# Patient Record
Sex: Male | Born: 2000 | Race: White | Hispanic: No | Marital: Single | Smoking: Never smoker
Health system: Southern US, Community
[De-identification: ages and names within clinical notes are randomized; demographics above are authoritative.]

---
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
## 2020-09-11 ENCOUNTER — Other Ambulatory Visit: Payer: Self-pay

## 2020-09-11 ENCOUNTER — Ambulatory Visit (INDEPENDENT_AMBULATORY_CARE_PROVIDER_SITE_OTHER): Payer: BC Managed Care – PPO

## 2020-09-11 ENCOUNTER — Ambulatory Visit
Admission: EM | Admit: 2020-09-11 | Discharge: 2020-09-11 | Disposition: A | Discharge status: Home / Self Care | Payer: BC Managed Care – PPO | Attending: Sports Medicine | Admitting: Sports Medicine

## 2020-09-11 DIAGNOSIS — M722 Plantar fascial fibromatosis: Secondary | ICD-10-CM

## 2020-09-11 DIAGNOSIS — M79671 Pain in right foot: Secondary | ICD-10-CM

## 2020-09-11 DIAGNOSIS — S99921A Unspecified injury of right foot, initial encounter: Secondary | ICD-10-CM

## 2020-09-11 DIAGNOSIS — M7751 Other enthesopathy of right foot: Secondary | ICD-10-CM

## 2020-09-11 MED ORDER — PREDNISONE 10 MG (21) PO TBPK: ORAL_TABLET | Freq: Every day | ORAL | 0 refills | Status: DC

## 2020-09-11 MED ORDER — METHYLPREDNISOLONE SODIUM SUCC 125 MG IJ SOLR
125.0000 mg | Freq: Once | INTRAMUSCULAR | Status: AC
  Administered 2020-09-11: 125 mg via INTRAMUSCULAR

## 2020-09-11 NOTE — Discharge Instructions (Addendum)
As we discussed, your exam is consistent with several issues.  You certainly have a lot of inflammation in that right heel and have bursitis, and tendinitis as well as plantar fasciitis. We gave you a steroid injection today. I sent in a prescription for oral steroids that you begin tomorrow and you can take with food. Also provided educational handouts. Icing and elevation. No Motrin, Advil, ibuprofen, Naprosyn, Aleve, or aspirin products while taking the steroids.  You can take Tylenol only. We discussed a work note but you deferred. I did give you the name and number of a foot and ankle expert and you can give them a call and get seen as soon as possible.  You may need some physical therapy. If he can get in and your symptoms worsen then please go to the ER for higher level of care.

## 2020-09-11 NOTE — ED Provider Notes (Signed)
[facility]    CSN: [id number] Arrival date & time: 09/11/20  1558      History   Chief Complaint Chief Complaint  Patient presents with   Foot Pain    HPI Joshua Hays is a 20 y.o. male.   20 year old male who presents for evaluation of an injury to his right foot.  He reports she was at the beach more than a week ago and he jumped into a lazy river and kind landed awkwardly on his right foot.  Most the pain was in the heel.  He really did not think much of it.  Was doing a lot of walking and just lounging and relaxing it did not bother him.  Since he has been back into his regular activity and going to work the last few days he has had a lot more pain.  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  He is noted a little bit of swelling but no bruising.  He was concerned there may be a fracture and comes in to urgent care for initial evaluation.  Normally sees Dr. Maryjane HurterFeldpausch at [facility] [place] in [place].  No chronic problems with this foot or ankle.  Has been using ibuprofen and it has not been helping at all.  He denies any back issues numbness tingling or shooting pain down his leg.  No red flag signs or symptoms elicited on history.   History reviewed. No pertinent past medical history.  There are no problems to display for this patient.   History reviewed. No pertinent surgical history.     Home Medications    Prior to Admission medications   Medication Sig Start Date End Date Taking? Authorizing Provider  predniSONE (STERAPRED UNI-PAK 21 TAB) 10 MG (21) TBPK tablet Take by mouth daily. Take 6 tabs by mouth daily  for 2 days, then 5 tabs for 2 days, then 4 tabs for 2 days, then 3 tabs for 2 days, 2 tabs for 2 days, then 1 tab by mouth daily for 2 days. Start 09/12/20 09/11/20  Yes Delton SeeBarnes, Thekla Colborn, MD  amoxicillin (AMOXIL) 875 MG tablet Take 1 tablet (875 mg total) by mouth 2 (two) times daily. 08/30/15   Lutricia Feiloemer, William P, PA-C   amoxicillin-clavulanate (AUGMENTIN) 875-125 MG per tablet Take 1 tablet by mouth 2 (two) times daily. 07/08/14   Hassan RowanWade, Eugene, MD  loratadine-pseudoephedrine (CLARITIN-D 12 HOUR) 5-120 MG per tablet Take 1 tablet by mouth 2 (two) times daily. Sig 1 tablet once or twice a day 07/02/14   Hassan RowanWade, Eugene, MD  valACYclovir Ralph Dowdy(VALTREX) 1000 MG tablet Sig two tablets  Twice a day for one day 07/02/14   Hassan RowanWade, Eugene, MD    Family History History reviewed. No pertinent family history.  Social History Social History   Tobacco Use   Smoking status: Never   Smokeless tobacco: Current    Types: Chew  Vaping Use   Vaping Use: Every day  Substance Use Topics   Alcohol use: No     Allergies   Patient has no known allergies.   Review of Systems Review of Systems  Constitutional:  Positive for activity change. Negative for appetite change, chills, diaphoresis, fatigue and fever.  HENT:  Negative for congestion, ear pain, postnasal drip, rhinorrhea, sinus pressure, sinus pain, sneezing and sore throat.   Eyes:  Negative for pain.  Respiratory:  Negative for cough, chest tightness and shortness of breath.   Cardiovascular:  Negative  for chest pain and palpitations.  Gastrointestinal:  Negative for abdominal pain, diarrhea, nausea and vomiting.  Genitourinary:  Negative for dysuria.  Musculoskeletal:  Positive for arthralgias and gait problem. Negative for back pain, myalgias and neck pain.  Skin:  Negative for color change, pallor, rash and wound.  Neurological:  Negative for dizziness, light-headedness, numbness and headaches.  All other systems reviewed and are negative.   Physical Exam Triage Vital Signs ED Triage Vitals  Enc Vitals Group     BP 09/11/20 1612 132/87     Pulse Rate 09/11/20 1612 73     Resp 09/11/20 1612 18     Temp 09/11/20 1612 98.6 F (37 C)     Temp Source 09/11/20 1612 Oral     SpO2 09/11/20 1612 99 %     Weight 09/11/20 1607 185 lb (83.9 kg)     Height 09/11/20 1607  6\' 3"  (1.905 m)     Head Circumference --      Peak Flow --      Pain Score 09/11/20 1607 6     Pain Loc --      Pain Edu? --      Excl. in GC? --    No data found.  Updated Vital Signs BP 132/87 (BP Location: Left Arm)   Pulse 73   Temp 98.6 F (37 C) (Oral)   Resp 18   Ht 6\' 3"  (1.905 m)   Wt 83.9 kg   SpO2 99%   BMI 23.12 kg/m   Visual Acuity Right Eye Distance:   Left Eye Distance:   Bilateral Distance:    Right Eye Near:   Left Eye Near:    Bilateral Near:     Physical Exam Vitals and nursing note reviewed.  Constitutional:      General: He is not in acute distress.    Appearance: Normal appearance. He is not ill-appearing, toxic-appearing or diaphoretic.  HENT:     Head: Normocephalic and atraumatic.     Nose: Nose normal.     Mouth/Throat:     Mouth: Mucous membranes are moist.  Eyes:     Conjunctiva/sclera: Conjunctivae normal.     Pupils: Pupils are equal, round, and reactive to light.  Cardiovascular:     Rate and Rhythm: Normal rate and regular rhythm.     Pulses: Normal pulses.     Heart sounds: Normal heart sounds. No murmur heard.   No friction rub. No gallop.  Pulmonary:     Effort: Pulmonary effort is normal.     Breath sounds: Normal breath sounds. No stridor. No wheezing, rhonchi or rales.  Musculoskeletal:        General: Tenderness and signs of injury present. No deformity.     Cervical back: Normal range of motion and neck supple.     Comments: Left foot and ankle: Normal to inspection palpation range of motion special test.  Right foot and ankle: No obvious bony abnormality ecchymosis or erythema.  There is a little bit of soft tissue swelling around mostly the medial malleolus and around the tib post tendon.  He is tender to palpation over the retrocalcaneal bursa.  A little bit tender over the fat pad and and over the plantar fascial.  Also little tender over the Achilles tendon.  There is no step deformity noted.  There is no strength  deficits.  Homans and Wilderness Rim tests are negative.  There is no midfoot instability.  No evidence of any tendon retraction.  Neurovascular normal sensation 2+ pulses.  Skin:    General: Skin is warm and dry.     Capillary Refill: Capillary refill takes less than 2 seconds.  Neurological:     General: No focal deficit present.     Mental Status: He is alert and oriented to person, place, and time.     UC Treatments / Results  Labs (all labs ordered are listed, but only abnormal results are displayed) Labs Reviewed - No data to display  EKG   Radiology DG Foot Complete Right  Result Date: 09/11/2020 CLINICAL DATA:  Injury 1 week ago, right foot pain, swelling EXAM: RIGHT FOOT COMPLETE - 3+ VIEW COMPARISON:  None. FINDINGS: Frontal, oblique, and lateral views of the right foot are obtained. No fracture, subluxation, or dislocation. Joint spaces are well preserved. Soft tissues are normal. IMPRESSION: 1. Unremarkable right foot. Electronically Signed   By: Sharlet Salina M.D.   On: 09/11/2020 16:52    Procedures Procedures (including critical care time)  Medications Ordered in UC Medications  methylPREDNISolone sodium succinate (SOLU-MEDROL) 125 mg/2 mL injection 125 mg (125 mg Intramuscular Given 09/11/20 1724)    Initial Impression / Assessment and Plan / UC Course  I have reviewed the triage vital signs and the nursing notes.  Pertinent labs & imaging results that were available during my care of the patient were reviewed by me and considered in my medical decision making (see chart for details).  Clinical impression: Right foot and ankle injury more than a week ago.  Seems to have gotten worse since he is on his feet a lot with work.  No new injury.  Clinically has plantar fasciitis, retrocalcaneal bursitis, tibialis posterior tendinitis, and Achilles tendinopathy.  Wonder whether or not he has altered his gait pattern which is caused all of this after the injury in the lazy  river.  Treatment plan: 1.  The findings and treatment plan were discussed in detail with the patient.  Patient was in agreement. 2.  Recommended getting an x-ray given he had trauma.  Complete films of the right foot were ordered and interpreted by myself here in the office today.  I appreciate any acute fracture.  Joint spaces are well-preserved.  We will await radiology over read and contact the patient if it differs from my read. 3.  I had a long discussion with him regarding his examination and that he has a lot of inflammation.  Given that the ibuprofen has not helped him I am going to go ahead and give him Solu-Medrol 125 mg IM.  Verbal consent was obtained. 4.  Gave him a 12-day steroid Dosepak that he will start tomorrow. 5.  Gave him the name of a local podiatrist and he will call and make an appointment.  He may need some physical therapy. 6.  Warned him not not to take any Advil Motrin ibuprofen Naprosyn or Aleve while taking the prednisone.  He can take Tylenol. 7.  Educational handouts provided. 8.  Icing and elevation and activity modification. 9.  We discussed giving him a work note but he deferred and he wanted to work. 10.  If his symptoms were to worsen and he was unable to get into podiatry then he should go to the emergency room. 11.  He was discharged in stable condition and will follow-up here as needed.    Final Clinical Impressions(s) / UC Diagnoses   Final diagnoses:  Right foot injury, initial encounter  Inflammatory pain of right heel  Retrocalcaneal bursitis (back of heel), right  Plantar fasciitis, right     Discharge Instructions      As we discussed, your exam is consistent with several issues.  You certainly have a lot of inflammation in that right heel and have bursitis, and tendinitis as well as plantar fasciitis. We gave you a steroid injection today. I sent in a prescription for oral steroids that you begin tomorrow and you can take with food. Also  provided educational handouts. Icing and elevation. No Motrin, Advil, ibuprofen, Naprosyn, Aleve, or aspirin products while taking the steroids.  You can take Tylenol only. We discussed a work note but you deferred. I did give you the name and number of a foot and ankle expert and you can give them a call and get seen as soon as possible.  You may need some physical therapy. If he can get in and your symptoms worsen then please go to the ER for higher level of care.     ED Prescriptions     Medication Sig Dispense Auth. Provider   predniSONE (STERAPRED UNI-PAK 21 TAB) 10 MG (21) TBPK tablet Take by mouth daily. Take 6 tabs by mouth daily  for 2 days, then 5 tabs for 2 days, then 4 tabs for 2 days, then 3 tabs for 2 days, 2 tabs for 2 days, then 1 tab by mouth daily for 2 days. Start 09/12/20 42 tablet Delton See, MD      PDMP not reviewed this encounter.   Delton See, MD 09/11/20 [phone]

## 2020-09-11 NOTE — ED Triage Notes (Signed)
Right foot, Pt states he was jumping into pool 1 week ago, felt like it was getting better, went to work Monday and states that it has been getting worst since being on it.

## 2021-01-09 ENCOUNTER — Encounter: Payer: Self-pay | Admitting: Emergency Medicine

## 2021-01-09 ENCOUNTER — Ambulatory Visit
Admission: EM | Admit: 2021-01-09 | Discharge: 2021-01-09 | Disposition: A | Discharge status: Home / Self Care | Payer: BC Managed Care – PPO | Attending: Internal Medicine | Admitting: Internal Medicine

## 2021-01-09 ENCOUNTER — Other Ambulatory Visit: Payer: Self-pay

## 2021-01-09 ENCOUNTER — Telehealth: Payer: Self-pay | Admitting: Emergency Medicine

## 2021-01-09 DIAGNOSIS — Z20822 Contact with and (suspected) exposure to covid-19: Secondary | ICD-10-CM | POA: Insufficient documentation

## 2021-01-09 DIAGNOSIS — H6591 Unspecified nonsuppurative otitis media, right ear: Secondary | ICD-10-CM | POA: Insufficient documentation

## 2021-01-09 DIAGNOSIS — J039 Acute tonsillitis, unspecified: Secondary | ICD-10-CM | POA: Diagnosis not present

## 2021-01-09 DIAGNOSIS — J069 Acute upper respiratory infection, unspecified: Secondary | ICD-10-CM | POA: Insufficient documentation

## 2021-01-09 LAB — RAPID INFLUENZA A&B ANTIGENS
Influenza A (ARMC): NEGATIVE
Influenza B (ARMC): NEGATIVE

## 2021-01-09 LAB — GROUP A STREP BY PCR: Group A Strep by PCR: NOT DETECTED

## 2021-01-09 MED ORDER — AMOXICILLIN 875 MG PO TABS: 875.0000 mg | ORAL_TABLET | Freq: Two times a day (BID) | ORAL | 0 refills | Status: DC

## 2021-01-09 MED ORDER — FLUTICASONE PROPIONATE 50 MCG/ACT NA SUSP: 2.0000 | Freq: Every day | NASAL | 0 refills | Status: DC

## 2021-01-09 NOTE — ED Triage Notes (Signed)
Pt c/o nasal congestion, chills, cough, body aches, and nausea. Started about 3 days ago. Denies fever.

## 2021-01-09 NOTE — Telephone Encounter (Signed)
Pt wanted rxs from today's visit sent to [facility] instead of [facility] in [place]. Rx resent in for pt.

## 2021-01-09 NOTE — ED Provider Notes (Signed)
[facility]    CSN: [id number] Arrival date & time: 01/09/21  6803      History   Chief Complaint Chief Complaint  Patient presents with   Nasal Congestion    HPI Joshua Hays is a 20 y.o. male who presents with nose congestion, chills, cough, body aches and nausea x 3 days. Denies fever. Since yesterday his R ear feels stuffy. ST is worse on the R than L.     History reviewed. No pertinent past medical history.  There are no problems to display for this patient.   History reviewed. No pertinent surgical history.     Home Medications    Prior to Admission medications   Medication Sig Start Date End Date Taking? Authorizing Provider  amoxicillin (AMOXIL) 875 MG tablet Take 1 tablet (875 mg total) by mouth 2 (two) times daily. 01/09/21  Yes Rodriguez-Southworth, Nettie Elm, PA-C  fluticasone (FLONASE) 50 MCG/ACT nasal spray Place 2 sprays into both nostrils daily. 01/09/21  Yes Rodriguez-Southworth, Nettie Elm, PA-C    Family History No family history on file.  Social History Social History   Tobacco Use   Smoking status: Never   Smokeless tobacco: Current    Types: Chew  Vaping Use   Vaping Use: Every day  Substance Use Topics   Alcohol use: No   Drug use: Not Currently     Allergies   Patient has no known allergies.   Review of Systems Review of Systems   Physical Exam Triage Vital Signs ED Triage Vitals  Enc Vitals Group     BP 01/09/21 0847 (!) 143/81     Pulse Rate 01/09/21 0847 70     Resp 01/09/21 0847 18     Temp 01/09/21 0847 98.6 F (37 C)     Temp Source 01/09/21 0847 Oral     SpO2 01/09/21 0847 99 %     Weight 01/09/21 0845 184 lb 15.5 oz (83.9 kg)     Height 01/09/21 0845 6\' 3"  (1.905 m)     Head Circumference --      Peak Flow --      Pain Score 01/09/21 0844 0     Pain Loc --      Pain Edu? --      Excl. in [place]? --    No data found.  Updated Vital Signs BP (!) 143/81 (BP Location: Left Arm)   Pulse 70   Temp  98.6 F (37 C) (Oral)   Resp 18   Ht 6\' 3"  (1.905 m)   Wt 184 lb 15.5 oz (83.9 kg)   SpO2 99%   BMI 23.12 kg/m   Visual Acuity Right Eye Distance:   Left Eye Distance:   Bilateral Distance:    Right Eye Near:   Left Eye Near:    Bilateral Near:     Physical Exam Vitals and nursing note reviewed.  Constitutional:      General: He is not in acute distress.    Appearance: He is not toxic-appearing.  HENT:     Right Ear: Ear canal and external ear normal.     Left Ear: Tympanic membrane, ear canal and external ear normal.     Ears:     Comments: R TM is flat, dull and the bottom is erythematous     Nose: Rhinorrhea present.     Mouth/Throat:     Mouth: Mucous membranes are moist.     Comments: R tonsil with mildexudate Eyes:  General: No scleral icterus.    Conjunctiva/sclera: Conjunctivae normal.  Cardiovascular:     Rate and Rhythm: Normal rate and regular rhythm.     Heart sounds: No murmur heard. Pulmonary:     Effort: Pulmonary effort is normal.     Breath sounds: Normal breath sounds.  Musculoskeletal:        General: Normal range of motion.     Cervical back: Neck supple.  Lymphadenopathy:     Cervical: No cervical adenopathy.  Skin:    General: Skin is warm and dry.     Findings: No rash.  Neurological:     Mental Status: He is alert and oriented to person, place, and time.     Gait: Gait normal.  Psychiatric:        Mood and Affect: Mood normal.        Behavior: Behavior normal.        Thought Content: Thought content normal.        Judgment: Judgment normal.     UC Treatments / Results  Labs (all labs ordered are listed, but only abnormal results are displayed) Labs Reviewed  RAPID INFLUENZA A&B ANTIGENS  SARS CORONAVIRUS 2 (TAT 6-24 HRS)  GROUP A STREP BY PCR    EKG   Radiology No results found.  Procedures Procedures (including critical care time)  Medications Ordered in UC Medications - No data to display  Initial Impression  / Assessment and Plan / UC Course  I have reviewed the triage vital signs and the nursing notes. R SOM and early OM and tonsillitis I will call him back if Strep A PCR is positive, but he does have mychart.  I placed him on Flonase and Amoxicillin as noted. See instructions.     Final Clinical Impressions(s) / UC Diagnoses   Final diagnoses:  Right serous otitis media, unspecified chronicity  Acute tonsillitis, unspecified etiology  Upper respiratory tract infection, unspecified type     Discharge Instructions      Check your blood pressure after a few days and needs to be less than 140/90 Stop the Dayquil You may take Delsym for cough as needed      ED Prescriptions     Medication Sig Dispense Auth. Provider   amoxicillin (AMOXIL) 875 MG tablet Take 1 tablet (875 mg total) by mouth 2 (two) times daily. 14 tablet Rodriguez-Southworth, Chrisie Jankovich, PA-C   fluticasone (FLONASE) 50 MCG/ACT nasal spray Place 2 sprays into both nostrils daily. 18.2 g Rodriguez-Southworth, Nettie Elm, PA-C      PDMP not reviewed this encounter.   Garey Ham, [place] 01/09/21 [phone]

## 2021-01-09 NOTE — Discharge Instructions (Signed)
Check your blood pressure after a few days and needs to be less than 140/90 Stop the Dayquil You may take Delsym for cough as needed

## 2021-01-10 LAB — SARS CORONAVIRUS 2 (TAT 6-24 HRS): SARS Coronavirus 2: NEGATIVE

## 2022-03-13 ENCOUNTER — Ambulatory Visit
Admission: EM | Admit: 2022-03-13 | Discharge: 2022-03-13 | Disposition: A | Discharge status: Home / Self Care | Payer: BC Managed Care – PPO | Attending: Physician Assistant | Admitting: Physician Assistant

## 2022-03-13 ENCOUNTER — Encounter: Payer: Self-pay | Admitting: Emergency Medicine

## 2022-03-13 DIAGNOSIS — R051 Acute cough: Secondary | ICD-10-CM | POA: Diagnosis not present

## 2022-03-13 DIAGNOSIS — R5383 Other fatigue: Secondary | ICD-10-CM

## 2022-03-13 DIAGNOSIS — J101 Influenza due to other identified influenza virus with other respiratory manifestations: Secondary | ICD-10-CM | POA: Insufficient documentation

## 2022-03-13 DIAGNOSIS — Z1152 Encounter for screening for COVID-19: Secondary | ICD-10-CM | POA: Insufficient documentation

## 2022-03-13 LAB — GROUP A STREP BY PCR: Group A Strep by PCR: NOT DETECTED

## 2022-03-13 LAB — RESP PANEL BY RT-PCR (RSV, FLU A&B, COVID)  RVPGX2
Influenza A by PCR: NEGATIVE
Influenza B by PCR: POSITIVE — AB
Resp Syncytial Virus by PCR: NEGATIVE
SARS Coronavirus 2 by RT PCR: NEGATIVE

## 2022-03-13 MED ORDER — OSELTAMIVIR PHOSPHATE 75 MG PO CAPS: 75.0000 mg | ORAL_CAPSULE | Freq: Two times a day (BID) | ORAL | 0 refills | Status: AC

## 2022-03-13 MED ORDER — PROMETHAZINE-DM 6.25-15 MG/5ML PO SYRP: 5.0000 mL | ORAL_SOLUTION | Freq: Four times a day (QID) | ORAL | 0 refills | Status: DC | PRN

## 2022-03-13 NOTE — ED Triage Notes (Signed)
Patient c/o sore throat, runny nose, cough, and congestion that started yesterday.  Patient denies fevers.

## 2022-03-13 NOTE — Discharge Instructions (Addendum)
+  flu B  URI/COLD SYMPTOMS: Your exam today is consistent with a viral illness. Antibiotics are not indicated at this time. Use medications as directed, including cough syrup, nasal saline, and decongestants. Your symptoms should improve over the next few days and resolve within 7-10 days. Increase rest and fluids. F/u if symptoms worsen or predominate such as sore throat, ear pain, productive cough, shortness of breath, or if you develop high fevers or worsening fatigue over the next several days.

## 2022-03-13 NOTE — ED Provider Notes (Addendum)
[facility]    CSN: [id number] Arrival date & time: 03/13/22  1317      History   Chief Complaint Chief Complaint  Patient presents with   Cough   Sore Throat   Nasal Congestion    HPI Joshua Hays is a 22 y.o. male presenting for onset of fatigue, cough, congestion and sore throat that began yesterday.  He denies fever but has had sweats and chills.  States he has been taking ibuprofen and Tylenol around-the-clock.  He denies any breathing difficulty, vomiting or diarrhea.  He denies any sick contacts.  Says he has not been this sick in a long time.  He is otherwise healthy.  No other concerns.  HPI  History reviewed. No pertinent past medical history.  There are no problems to display for this patient.   History reviewed. No pertinent surgical history.     Home Medications    Prior to Admission medications   Medication Sig Start Date End Date Taking? Authorizing Provider  oseltamivir (TAMIFLU) 75 MG capsule Take 1 capsule (75 mg total) by mouth every 12 (twelve) hours for 5 days. 03/13/22 03/18/22 Yes Danton Clap, PA-C  promethazine-dextromethorphan (PROMETHAZINE-DM) 6.25-15 MG/5ML syrup Take 5 mLs by mouth 4 (four) times daily as needed. 03/13/22  Yes Danton Clap PA-C    Family History History reviewed. No pertinent family history.  Social History Social History   Tobacco Use   Smoking status: Never   Smokeless tobacco: Former    Types: Nurse, children's Use: Every day  Substance Use Topics   Alcohol use: No   Drug use: Not Currently     Allergies   Patient has no known allergies.   Review of Systems Review of Systems  Constitutional:  Positive for chills, diaphoresis and fatigue. Negative for fever.  HENT:  Positive for congestion, rhinorrhea and sore throat. Negative for sinus pressure and sinus pain.   Respiratory:  Positive for cough. Negative for shortness of breath.   Gastrointestinal:  Negative for abdominal  pain, diarrhea, nausea and vomiting.  Musculoskeletal:  Negative for myalgias.  Neurological:  Negative for weakness, light-headedness and headaches.  Hematological:  Negative for adenopathy.     Physical Exam Triage Vital Signs ED Triage Vitals  Enc Vitals Group     BP      Pulse      Resp      Temp      Temp src      SpO2      Weight      Height      Head Circumference      Peak Flow      Pain Score      Pain Loc      Pain Edu?      Excl. in [place]?    No data found.  Updated Vital Signs BP (!) 155/75 (BP Location: Right Arm)   Pulse 86   Temp 99.7 F (37.6 C) (Oral)   Resp 15   Ht 6\' 3"  (1.905 m)   Wt 200 lb (90.7 kg)   SpO2 96%   BMI 25.00 kg/m   Physical Exam Vitals and nursing note reviewed.  Constitutional:      General: He is not in acute distress.    Appearance: Normal appearance. He is well-developed. He is not ill-appearing.  HENT:     Head: Normocephalic and atraumatic.     Nose: Congestion present.  Mouth/Throat:     Mouth: Mucous membranes are moist.     Pharynx: Oropharynx is clear. Posterior oropharyngeal erythema present.  Eyes:     General: No scleral icterus.    Conjunctiva/sclera: Conjunctivae normal.  Cardiovascular:     Rate and Rhythm: Normal rate and regular rhythm.     Heart sounds: Normal heart sounds.  Pulmonary:     Effort: Pulmonary effort is normal. No respiratory distress.     Breath sounds: Normal breath sounds.  Musculoskeletal:     Cervical back: Neck supple.  Skin:    General: Skin is warm and dry.     Capillary Refill: Capillary refill takes less than 2 seconds.  Neurological:     General: No focal deficit present.     Mental Status: He is alert. Mental status is at baseline.     Motor: No weakness.     Gait: Gait normal.  Psychiatric:        Mood and Affect: Mood normal.        Behavior: Behavior normal.      UC Treatments / Results  Labs (all labs ordered are listed, but only abnormal results are  displayed) Labs Reviewed  RESP PANEL BY RT-PCR (RSV, FLU A&B, COVID)  RVPGX2 - Abnormal; Notable for the following components:      Result Value   Influenza B by PCR POSITIVE (*)    All other components within normal limits  GROUP A STREP BY PCR    EKG   Radiology No results found.  Procedures Procedures (including critical care time)  Medications Ordered in UC Medications - No data to display  Initial Impression / Assessment and Plan / UC Course  I have reviewed the triage vital signs and the nursing notes.  Pertinent labs & imaging results that were available during my care of the patient were reviewed by me and considered in my medical decision making (see chart for details).   22 year old male presents for cough, congestion, sore throat throat that began yesterday.  No fever but has had sweats and chills and reports taking antipyretics around-the-clock.  He is presently afebrile.  He is overall well-appearing.  On exam he has nasal congestion and erythema posterior pharynx.  Chest clear auscultation.  PCR strep and respiratory panel obtained.  Positive for influenza B.  Discussed results with patient.  He is within the window for treatment with Tamiflu so sent that to the pharmacy as well as Promethazine DM and provided with work note.  Supportive care advised.  Reviewed return precautions.   Final Clinical Impressions(s) / UC Diagnoses   Final diagnoses:  Influenza B  Acute cough  Other fatigue     Discharge Instructions      +flu B  URI/COLD SYMPTOMS: Your exam today is consistent with a viral illness. Antibiotics are not indicated at this time. Use medications as directed, including cough syrup, nasal saline, and decongestants. Your symptoms should improve over the next few days and resolve within 7-10 days. Increase rest and fluids. F/u if symptoms worsen or predominate such as sore throat, ear pain, productive cough, shortness of breath, or if you develop high  fevers or worsening fatigue over the next several days.       ED Prescriptions     Medication Sig Dispense Auth. Provider   oseltamivir (TAMIFLU) 75 MG capsule Take 1 capsule (75 mg total) by mouth every 12 (twelve) hours for 5 days. 10 capsule Shirlee Latch, PA-C   promethazine-dextromethorphan (  PROMETHAZINE-DM) 6.25-15 MG/5ML syrup Take 5 mLs by mouth 4 (four) times daily as needed. 118 mL Danton Clap, PA-C      PDMP not reviewed this encounter.   Danton Clap, PA-C 03/13/22 1501    Laurene Footman B, PA-C 03/13/22 1502

## 2023-03-15 IMAGING — CR DG FOOT COMPLETE 3+V*R*
3 series · 3 of 3 positions shown · non-contrast
Comparison: None.

CLINICAL DATA: Injury 1 week ago, right foot pain, swelling

EXAM:
RIGHT FOOT COMPLETE - 3+ VIEW

[foot ap]
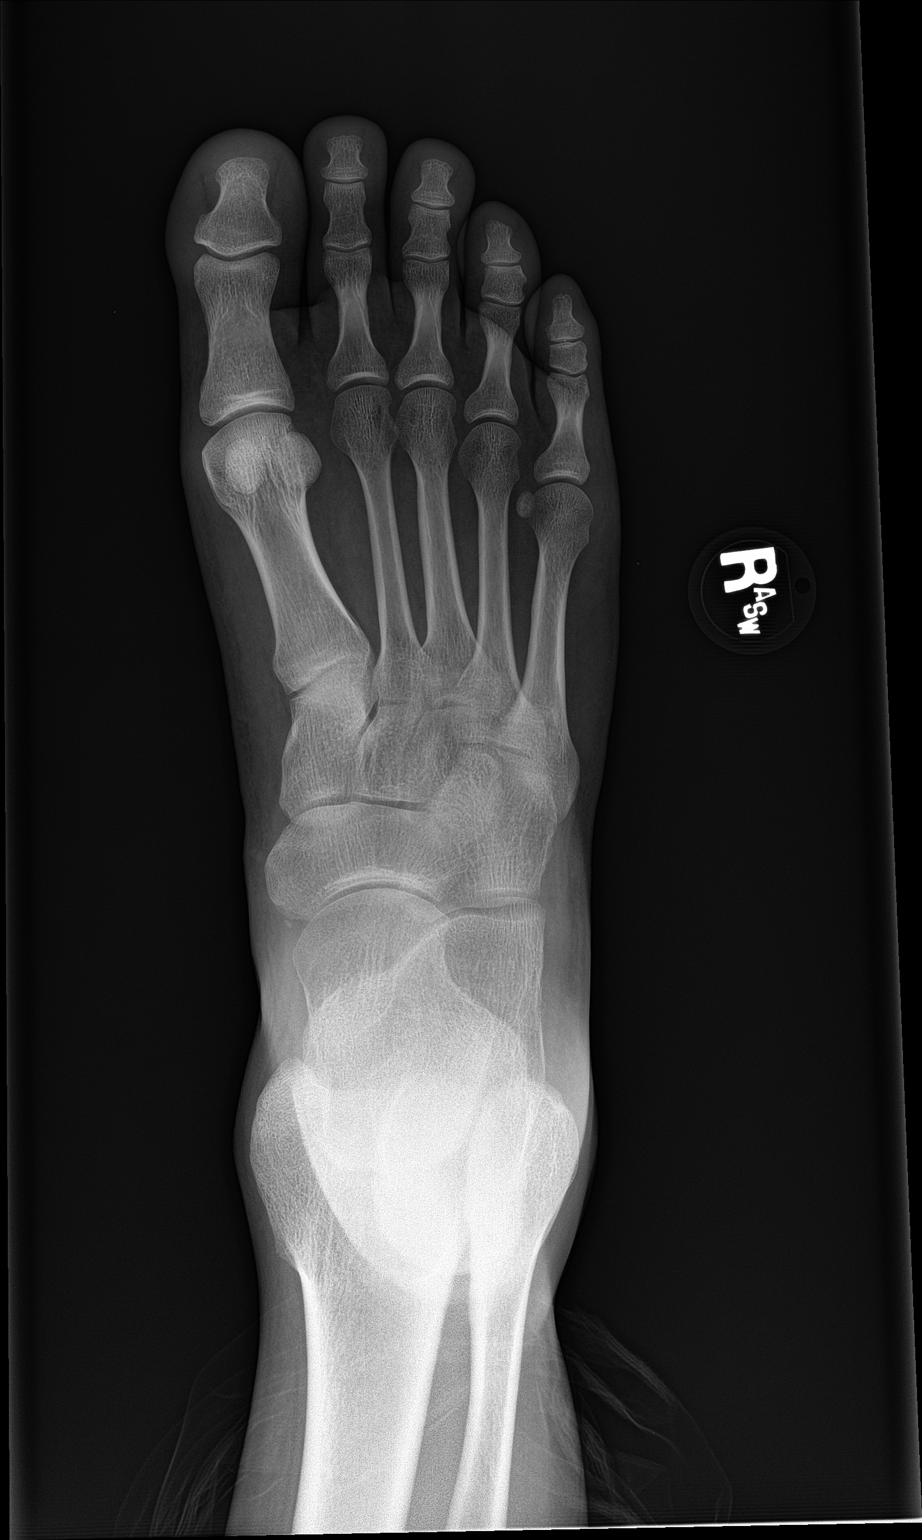

[foot obl]
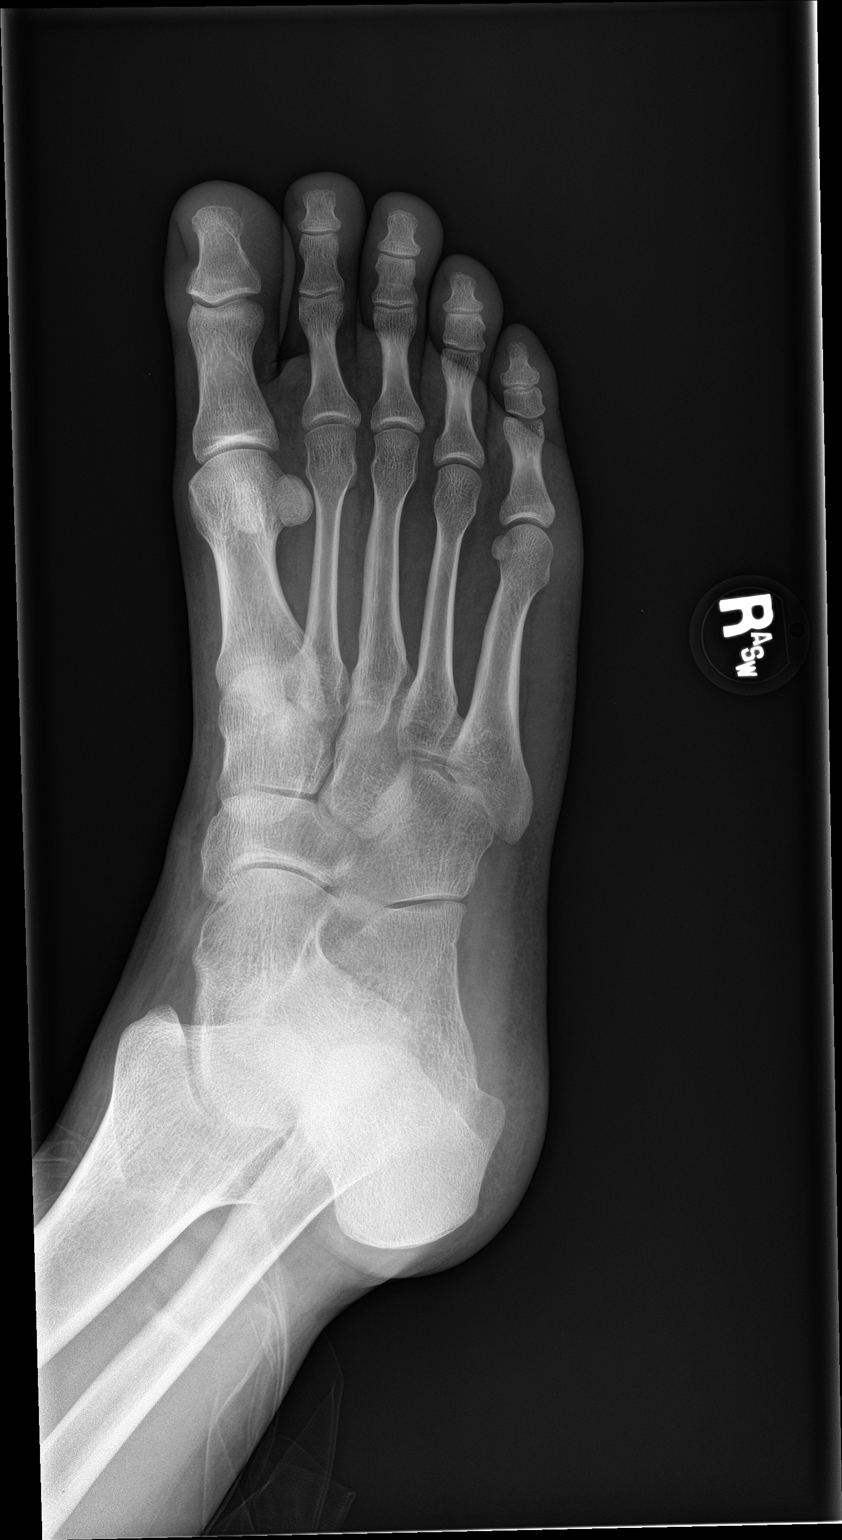

[foot lat]
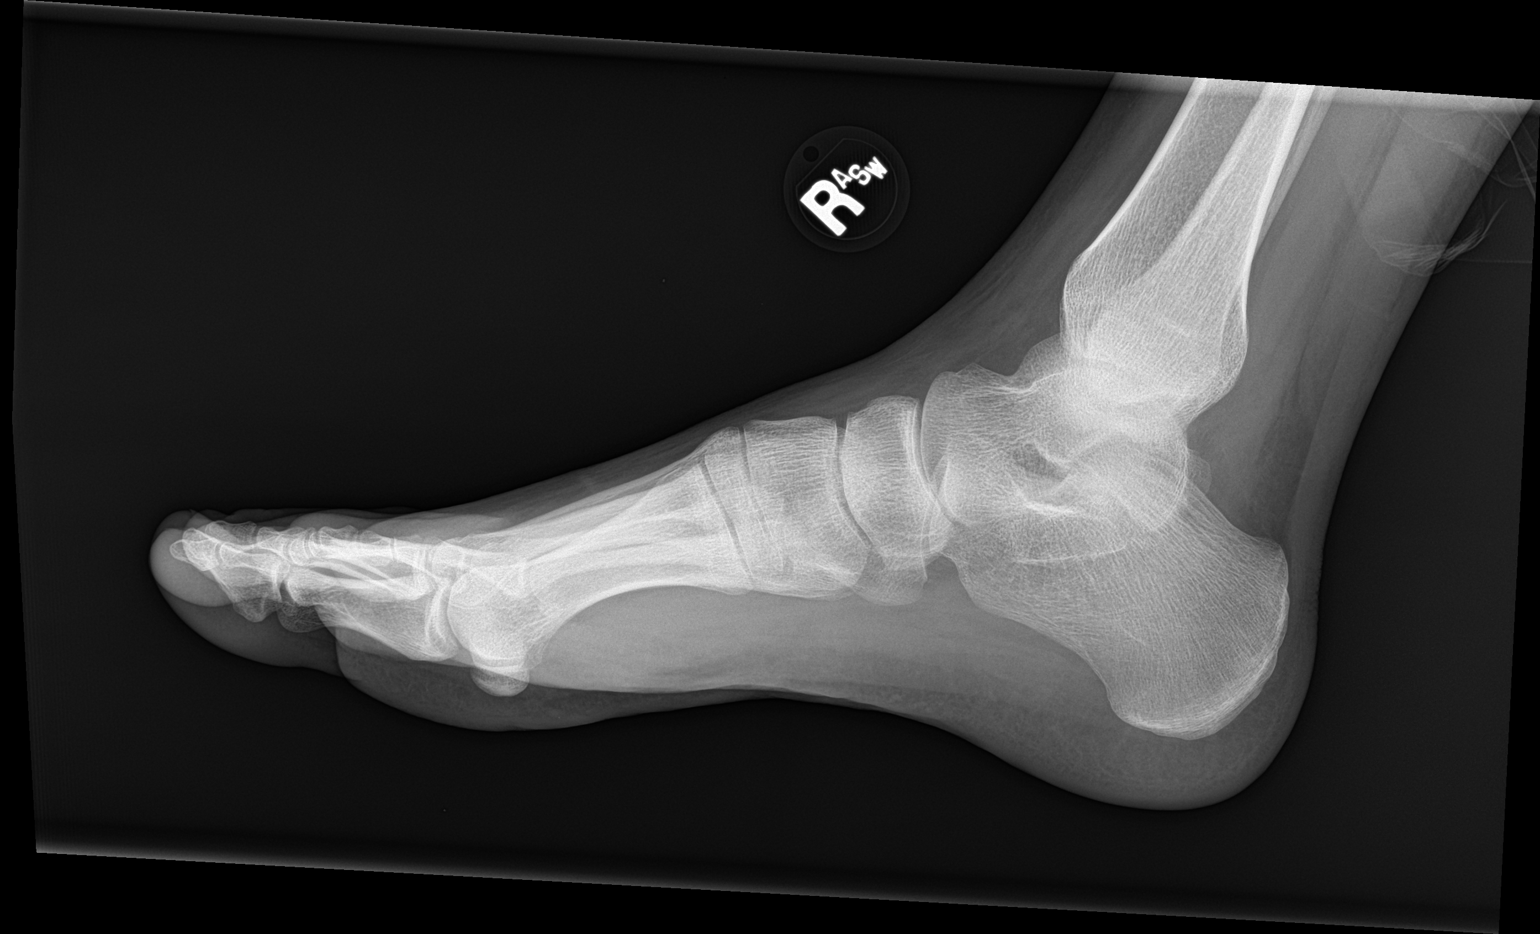

[3 of 3 positions shown; findings below may reference images not displayed]

FINDINGS: Frontal, oblique, and lateral views of the right foot are obtained.
No fracture, subluxation, or dislocation. Joint spaces are well
preserved. Soft tissues are normal.
IMPRESSION: 1. Unremarkable right foot.

## 2023-03-31 ENCOUNTER — Ambulatory Visit
Admission: EM | Admit: 2023-03-31 | Discharge: 2023-03-31 | Disposition: A | Discharge status: Home / Self Care | Payer: BC Managed Care – PPO | Attending: Physician Assistant | Admitting: Physician Assistant

## 2023-03-31 DIAGNOSIS — K1379 Other lesions of oral mucosa: Secondary | ICD-10-CM

## 2023-03-31 DIAGNOSIS — J029 Acute pharyngitis, unspecified: Secondary | ICD-10-CM

## 2023-03-31 LAB — GROUP A STREP BY PCR: Group A Strep by PCR: NOT DETECTED

## 2023-03-31 MED ORDER — PREDNISONE 20 MG PO TABS
40.0000 mg | ORAL_TABLET | Freq: Every day | ORAL | 0 refills | Status: AC
Start: 2023-03-31 — End: 2023-04-05

## 2023-03-31 MED ORDER — LIDOCAINE VISCOUS HCL 2 % MT SOLN: 15.0000 mL | OROMUCOSAL | 0 refills | Status: AC | PRN

## 2023-03-31 NOTE — Discharge Instructions (Addendum)

## 2023-03-31 NOTE — ED Provider Notes (Signed)
 [facility]    CSN: [id number] Arrival date & time: 03/31/23  1304      History   Chief Complaint Chief Complaint  Patient presents with   Sore Throat   Ear Fullness    HPI Joshua Hays is a 23 y.o. male in for 9-day history of sore throat, painful swallowing, bilateral ear fullness and fatigue.  He reports a sore on his uvula and says that his uvula is swollen.  He has taken OTC meds without relief.  He feels that his symptoms are getting worse.  He denies fever, cough, congestion, sinus pain, chest pain, shortness of breath or facial swelling.  HPI  History reviewed. No pertinent past medical history.  There are no active problems to display for this patient.   History reviewed. No pertinent surgical history.     Home Medications    Prior to Admission medications   Medication Sig Start Date End Date Taking? Authorizing Provider  lidocaine  (XYLOCAINE ) 2 % solution Use as directed 15 mLs in the mouth or throat every 3 (three) hours as needed for mouth pain (swish and spit). 03/31/23  Yes Arvis Jolan NOVAK, PA-C  predniSONE  (DELTASONE ) 20 MG tablet Take 2 tablets (40 mg total) by mouth daily for 5 days. 03/31/23 04/05/23 Yes Arvis Jolan NOVAK, PA-C    Family History History reviewed. No pertinent family history.  Social History Social History   Tobacco Use   Smoking status: Never   Smokeless tobacco: Former    Types: Engineer, Drilling   Vaping status: Every Day  Substance Use Topics   Alcohol use: No   Drug use: Not Currently     Allergies   Patient has no known allergies.   Review of Systems Review of Systems  Constitutional:  Positive for fatigue. Negative for fever.  HENT:  Positive for sore throat. Negative for congestion, ear pain, rhinorrhea, sinus pressure and sinus pain.   Respiratory:  Negative for cough and shortness of breath.   Gastrointestinal:  Negative for abdominal pain, diarrhea, nausea and vomiting.  Musculoskeletal:  Negative  for myalgias.  Neurological:  Negative for weakness, light-headedness and headaches.  Hematological:  Negative for adenopathy.     Physical Exam Triage Vital Signs ED Triage Vitals  Encounter Vitals Group     BP 03/31/23 1442 139/74     Systolic BP Percentile --      Diastolic BP Percentile --      Pulse Rate 03/31/23 1442 72     Resp 03/31/23 1442 16     Temp 03/31/23 1442 98.7 F (37.1 C)     Temp Source 03/31/23 1442 Oral     SpO2 03/31/23 1442 97 %     Weight 03/31/23 1441 200 lb (90.7 kg)     Height 03/31/23 1441 6' 3 (1.905 m)     Head Circumference --      Peak Flow --      Pain Score 03/31/23 1444 7     Pain Loc --      Pain Education --      Exclude from Growth Chart --    No data found.  Updated Vital Signs BP 139/74 (BP Location: Right Arm)   Pulse 72   Temp 98.7 F (37.1 C) (Oral)   Resp 16   Ht 6' 3 (1.905 m)   Wt 200 lb (90.7 kg)   SpO2 97%   BMI 25.00 kg/m     Physical Exam Vitals and  nursing note reviewed.  Constitutional:      General: He is not in acute distress.    Appearance: Normal appearance. He is well-developed. He is not ill-appearing.  HENT:     Head: Normocephalic and atraumatic.     Right Ear: Tympanic membrane, ear canal and external ear normal.     Left Ear: Tympanic membrane, ear canal and external ear normal.     Nose: Nose normal.     Mouth/Throat:     Mouth: Mucous membranes are moist.     Pharynx: Pharyngeal swelling and posterior oropharyngeal erythema present.     Comments: Aphthous ulcer on uvula with moderate swelling of uvula and erythema of uvula. Eyes:     General: No scleral icterus.    Conjunctiva/sclera: Conjunctivae normal.  Cardiovascular:     Rate and Rhythm: Normal rate and regular rhythm.  Pulmonary:     Effort: Pulmonary effort is normal. No respiratory distress.     Breath sounds: Normal breath sounds.  Musculoskeletal:     Cervical back: Neck supple.  Lymphadenopathy:     Cervical: No cervical  adenopathy.  Skin:    General: Skin is warm and dry.     Capillary Refill: Capillary refill takes less than 2 seconds.  Neurological:     General: No focal deficit present.     Mental Status: He is alert. Mental status is at baseline.     Motor: No weakness.     Gait: Gait normal.  Psychiatric:        Mood and Affect: Mood normal.        Behavior: Behavior normal.      UC Treatments / Results  Labs (all labs ordered are listed, but only abnormal results are displayed) Labs Reviewed  GROUP A STREP BY PCR    EKG   Radiology No results found.  Procedures Procedures (including critical care time)  Medications Ordered in UC Medications - No data to display  Initial Impression / Assessment and Plan / UC Course  I have reviewed the triage vital signs and the nursing notes.  Pertinent labs & imaging results that were available during my care of the patient were reviewed by me and considered in my medical decision making (see chart for details).   23 year old male presents for 9-day history of sore throat, painful swallowing and ear fullness.  Strep testing negative.  Viral pharyngitis/aphthous ulcer.  Treating at this time with prednisone  and viscous lidocaine .  Also discussed use of OTC meds.  Reviewed return precautions.   Final Clinical Impressions(s) / UC Diagnoses   Final diagnoses:  Viral pharyngitis  Uvular swelling     Discharge Instructions      URI/COLD SYMPTOMS: Your exam today is consistent with a viral illness. Antibiotics are not indicated at this time. Use medications as directed, including cough syrup, nasal saline, and decongestants. Your symptoms should improve over the next few days and resolve within 7-10 days. Increase rest and fluids. F/u if symptoms worsen or predominate such as sore throat, ear pain, productive cough, shortness of breath, or if you develop high fevers or worsening fatigue over the next several days.       ED Prescriptions      Medication Sig Dispense Auth. Provider   predniSONE  (DELTASONE ) 20 MG tablet Take 2 tablets (40 mg total) by mouth daily for 5 days. 10 tablet Arvis Huxley B, PA-C   lidocaine  (XYLOCAINE ) 2 % solution Use as directed 15 mLs in the mouth or  throat every 3 (three) hours as needed for mouth pain (swish and spit). 100 mL Arvis Jolan NOVAK, PA-C      PDMP not reviewed this encounter.   Arvis Jolan NOVAK, PA-C 03/31/23 1536

## 2023-03-31 NOTE — ED Triage Notes (Signed)
 Pt c/o sore throat & bilateral ear fullness x1 wk. Hx of mono.
